# Patient Record
Sex: Female | Born: 2005 | Race: White | Hispanic: No | Marital: Single | Smoking: Never smoker
Health system: Southern US, Academic
[De-identification: ages and names within clinical notes are randomized; demographics above are authoritative.]

## PROBLEM LIST (undated history)

## (undated) DIAGNOSIS — K219 Gastro-esophageal reflux disease without esophagitis: Secondary | ICD-10-CM

## (undated) DIAGNOSIS — K649 Unspecified hemorrhoids: Secondary | ICD-10-CM

## (undated) HISTORY — PX: COLONOSCOPY: SHX174

## (undated) HISTORY — PX: NO PAST SURGERIES: SHX2092

## (undated) HISTORY — DX: Unspecified hemorrhoids: K64.9

## (undated) HISTORY — DX: Gastro-esophageal reflux disease without esophagitis: K21.9

---
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
## 2023-10-25 ENCOUNTER — Encounter (HOSPITAL_COMMUNITY): Payer: Self-pay

## 2023-11-29 ENCOUNTER — Other Ambulatory Visit (HOSPITAL_COMMUNITY): Payer: Self-pay | Admitting: NURSE PRACTITIONER

## 2023-11-29 ENCOUNTER — Ambulatory Visit
Admission: RE | Admit: 2023-11-29 | Discharge: 2023-11-29 | Disposition: A | Discharge status: Home / Self Care | Source: Ambulatory Visit | Attending: NURSE PRACTITIONER | Admitting: NURSE PRACTITIONER

## 2023-11-29 ENCOUNTER — Other Ambulatory Visit: Payer: Self-pay

## 2023-11-29 DIAGNOSIS — M25562 Pain in left knee: Secondary | ICD-10-CM
